# Patient Record
Sex: Female | Born: 1975 | Race: Black or African American | Hispanic: No | Marital: Single
Health system: Southern US, Community
[De-identification: ages and names within clinical notes are randomized; demographics above are authoritative.]

---

## 1999-01-16 ENCOUNTER — Other Ambulatory Visit: Admission: RE | Admit: 1999-01-16 | Discharge: 1999-01-16 | Payer: Self-pay | Admitting: Internal Medicine

## 2000-01-25 ENCOUNTER — Other Ambulatory Visit: Admission: RE | Admit: 2000-01-25 | Discharge: 2000-01-25 | Payer: Self-pay | Admitting: *Deleted

## 2001-06-18 ENCOUNTER — Inpatient Hospital Stay (HOSPITAL_COMMUNITY): Admission: AD | Admit: 2001-06-18 | Discharge: 2001-06-22 | Payer: Self-pay | Admitting: Gynecology

## 2001-06-23 ENCOUNTER — Encounter: Admission: RE | Admit: 2001-06-23 | Discharge: 2001-07-23 | Payer: Self-pay | Admitting: Gynecology

## 2001-07-28 ENCOUNTER — Other Ambulatory Visit: Admission: RE | Admit: 2001-07-28 | Discharge: 2001-07-28 | Payer: Self-pay | Admitting: *Deleted

## 2001-08-23 ENCOUNTER — Encounter: Admission: RE | Admit: 2001-08-23 | Discharge: 2001-09-22 | Payer: Self-pay | Admitting: Gynecology

## 2001-10-23 ENCOUNTER — Encounter: Admission: RE | Admit: 2001-10-23 | Discharge: 2001-11-22 | Payer: Self-pay | Admitting: Gynecology

## 2001-11-23 ENCOUNTER — Encounter: Admission: RE | Admit: 2001-11-23 | Discharge: 2001-12-23 | Payer: Self-pay | Admitting: Gynecology

## 2002-07-09 ENCOUNTER — Ambulatory Visit (HOSPITAL_COMMUNITY): Admission: RE | Admit: 2002-07-09 | Discharge: 2002-07-09 | Payer: Self-pay | Admitting: Gastroenterology

## 2003-08-25 ENCOUNTER — Other Ambulatory Visit: Admission: RE | Admit: 2003-08-25 | Discharge: 2003-08-25 | Payer: Self-pay | Admitting: Gynecology

## 2005-05-14 ENCOUNTER — Emergency Department: Payer: Self-pay | Admitting: Emergency Medicine

## 2005-08-09 ENCOUNTER — Observation Stay: Payer: Self-pay

## 2005-08-10 ENCOUNTER — Ambulatory Visit: Payer: Self-pay

## 2005-08-12 ENCOUNTER — Ambulatory Visit: Payer: Self-pay

## 2005-08-19 ENCOUNTER — Inpatient Hospital Stay: Payer: Self-pay | Admitting: Unknown Physician Specialty

## 2006-04-07 IMAGING — CT CT HEAD WITHOUT CONTRAST
2 series · 16 of 30 positions shown, 20 images · non-contrast
Comparison: none

REASON FOR EXAM: head inj/[HOSPITAL]   SHIELD/PATIENT IS 20 WEEKS PREGNANT
COMMENTS:

[Series 2: without · axial · non-contrast · 0.39mm/px · z∈[+162,+272]mm · 13 of 26 slices shown, 17 images]
[im 2/26  brain]
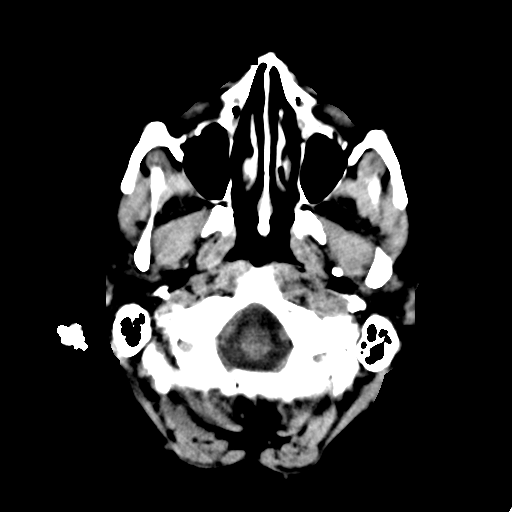
[im 2/26  bone]
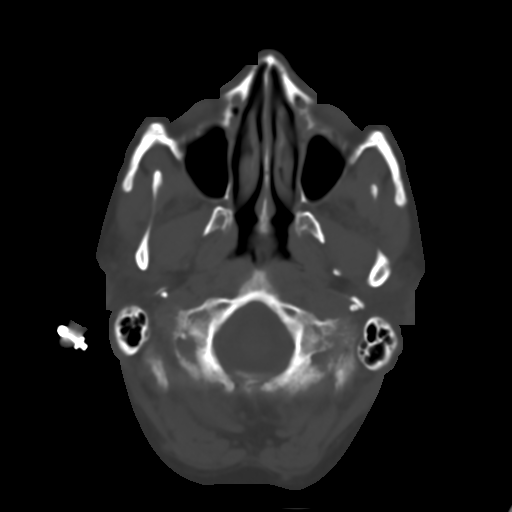
[im 4/26  brain]
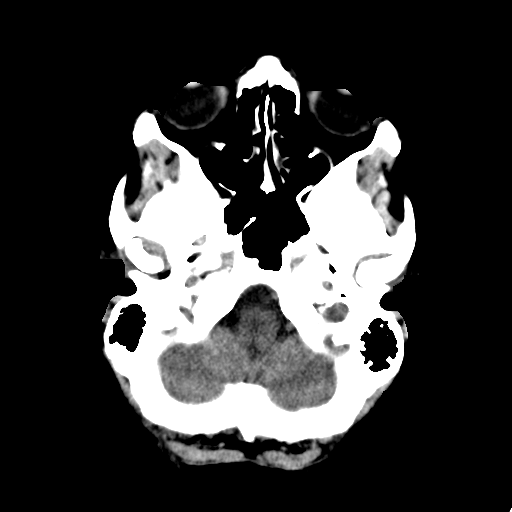
[im 6/26  brain]
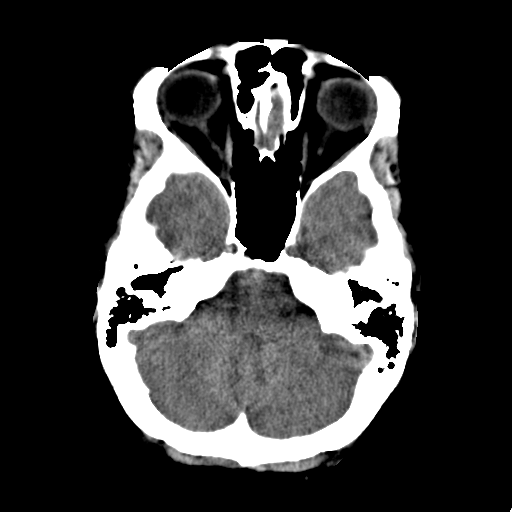
[im 8/26  brain]
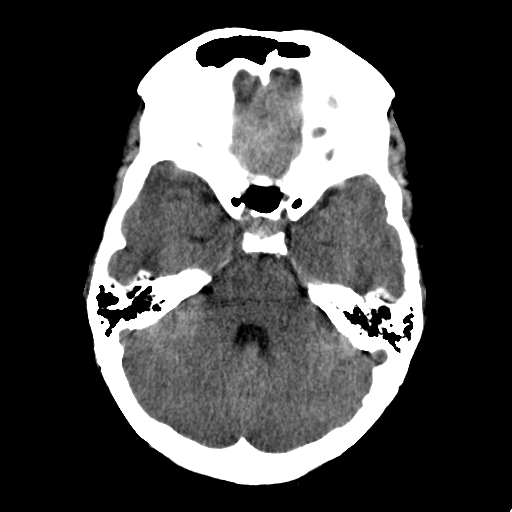
[im 9/26  brain]
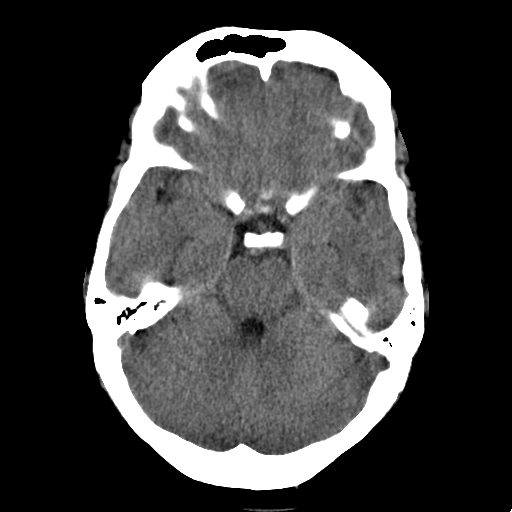
[im 9/26  bone]
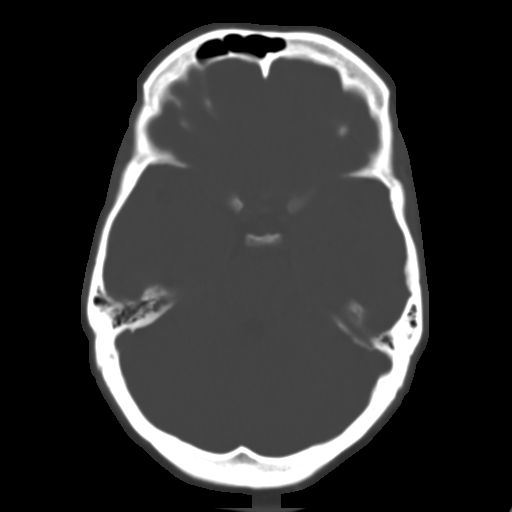
[im 11/26  brain]
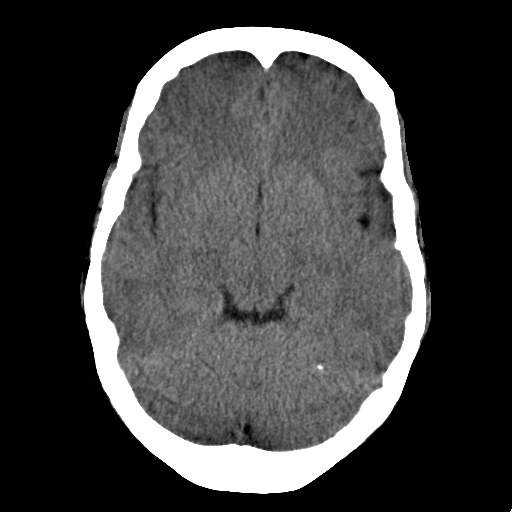
[im 13/26  brain]
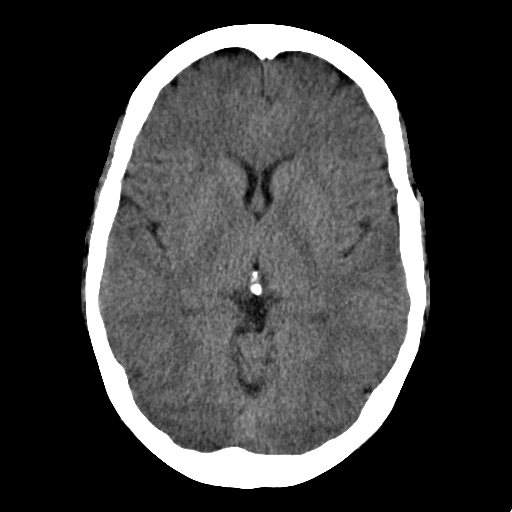
[im 15/26  brain]
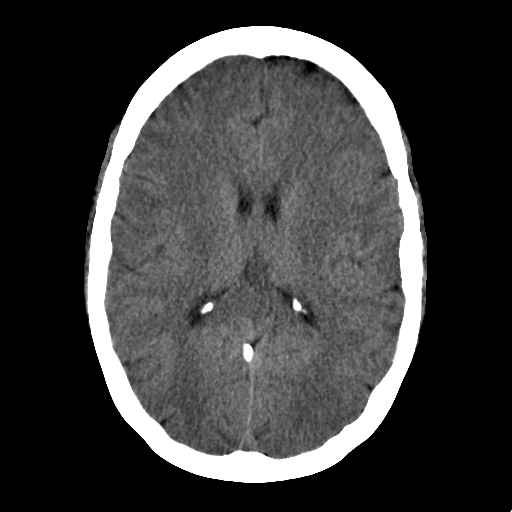
[im 17/26  brain]
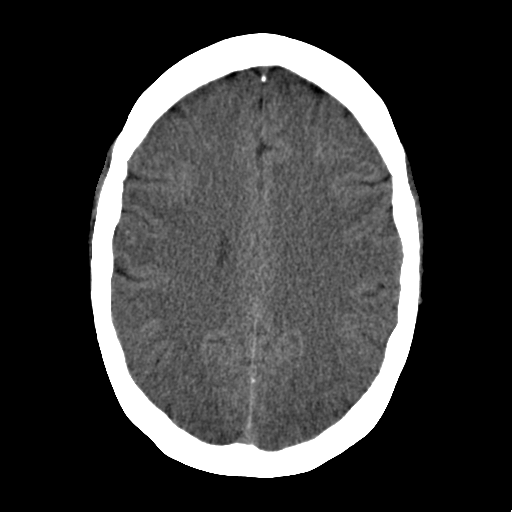
[im 17/26  bone]
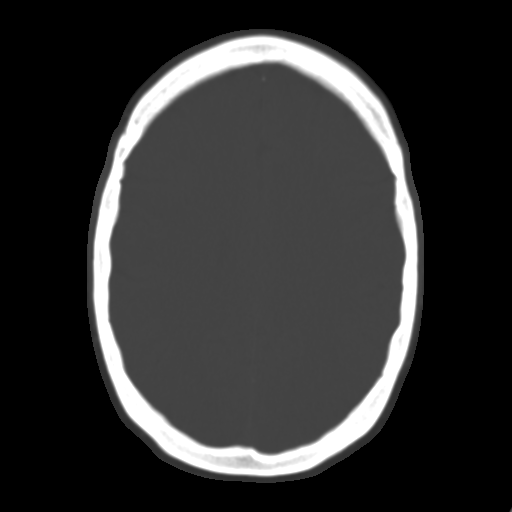
[im 18/26  brain]
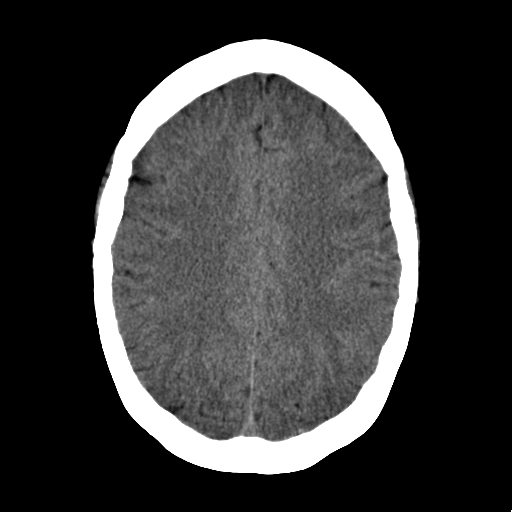
[im 20/26  brain]
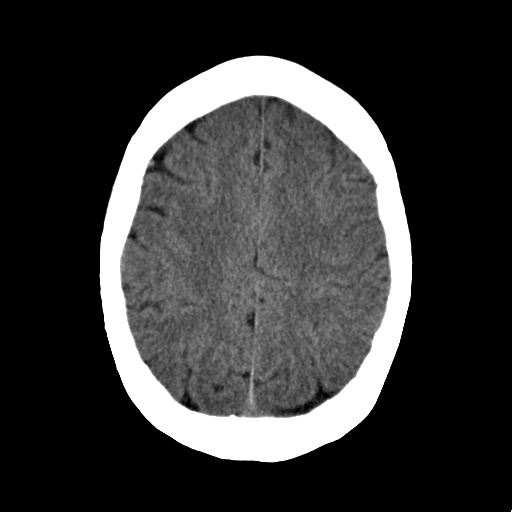
[im 22/26  brain]
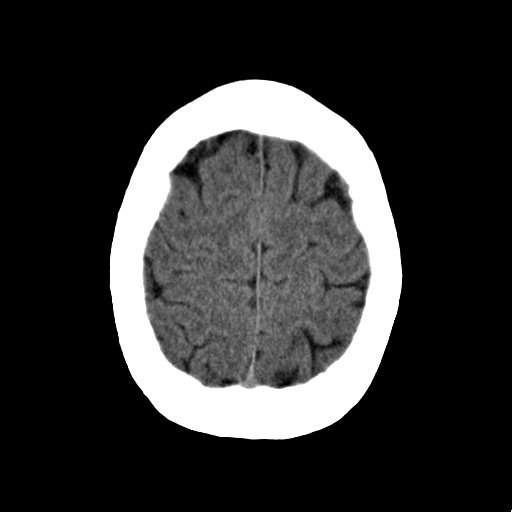
[im 24/26  brain]
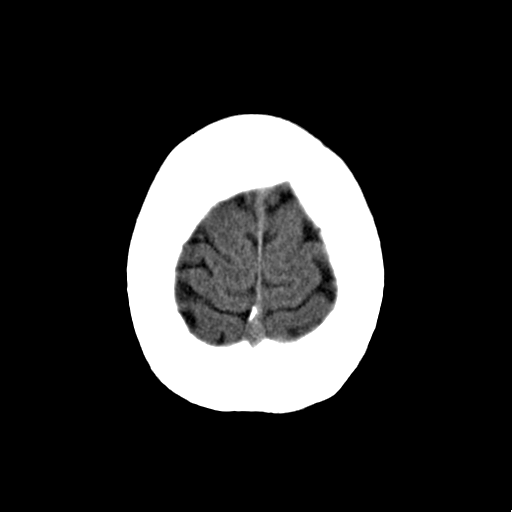
[im 24/26  bone]
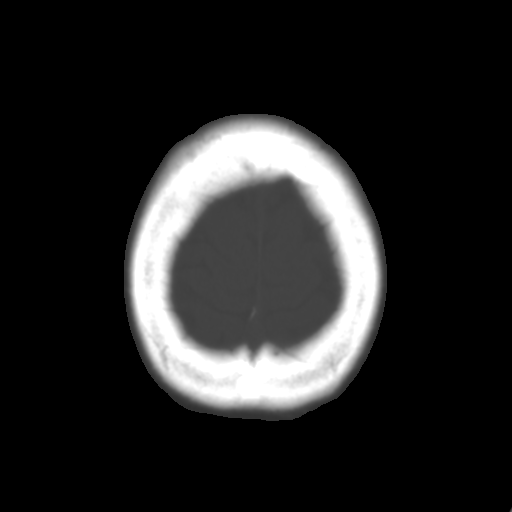

[Series 3: bone windows · axial · 0.39mm/px · z∈[+162,+198]mm · 3 of 26 slices shown]
[im 2/26  bone]
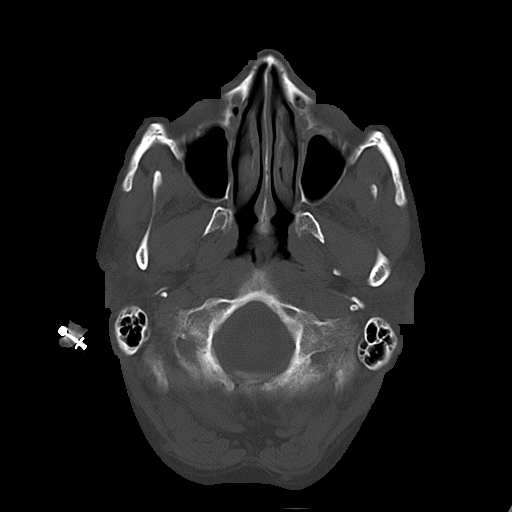
[im 6/26  bone]
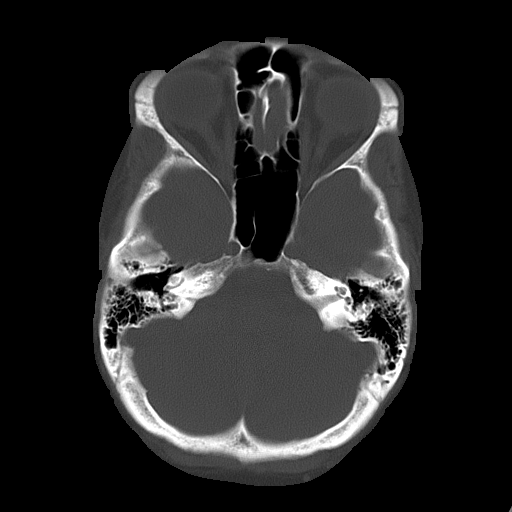
[im 9/26  bone]
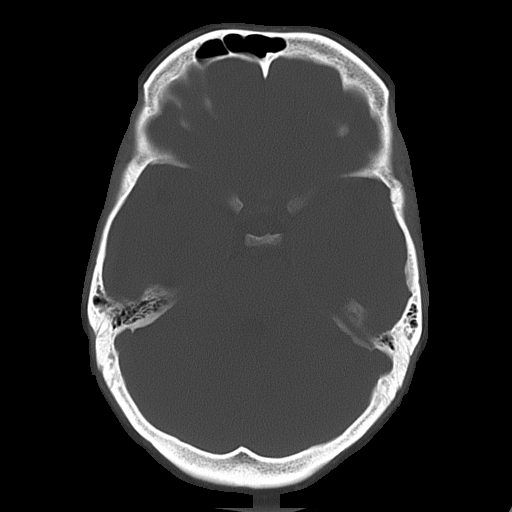

[16 of 30 positions shown; findings below may reference images not displayed]

PROCEDURE:     CT  - CT HEAD WITHOUT CONTRAST  - May 14, 2005  [DATE]

RESULT:       Unenhanced emergent head CT was performed.  No intracerebral
bleeds or infarcts are seen.  No mass effect.  No shift of the midline.
The ventricles appear within normal limits in size.  No extra-axial fluid
collections are noted.

No definite bony abnormalities are noted on the unenhanced head CT.  There
appears some minimal soft tissue swelling over the RIGHT frontal bone.
IMPRESSION: No significant abnormalities identified on the unenhanced emergent head CT.

The exam was originally read by [HOSPITAL].

## 2007-04-08 ENCOUNTER — Ambulatory Visit: Payer: Self-pay | Admitting: Internal Medicine

## 2007-12-05 ENCOUNTER — Emergency Department: Payer: Self-pay | Admitting: Emergency Medicine

## 2008-10-28 IMAGING — US US OB < 14 WEEKS - US OB TV
1 series · 17 of 28 positions shown · non-contrast
Comparison: none

REASON FOR EXAM: Vaginal bleeding, patient is pregnant - 9 weeks, 4 days
by LMP
COMMENTS:

[Series 1: us ob < 14 weeks - us ob tv · 17 of 43 slices shown]
[im 1/43]
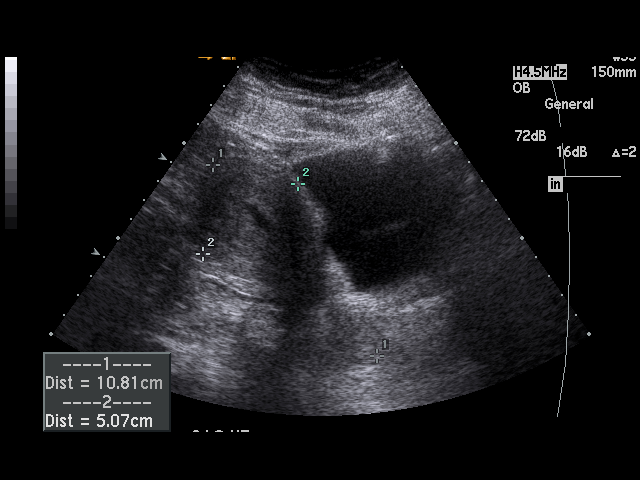
[im 4/43]
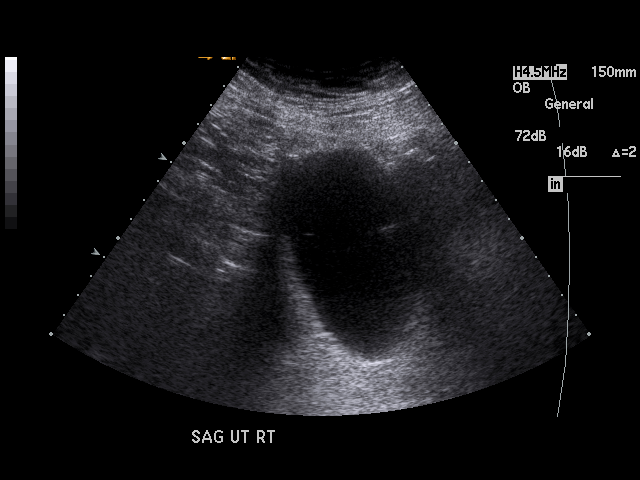
[im 7/43]
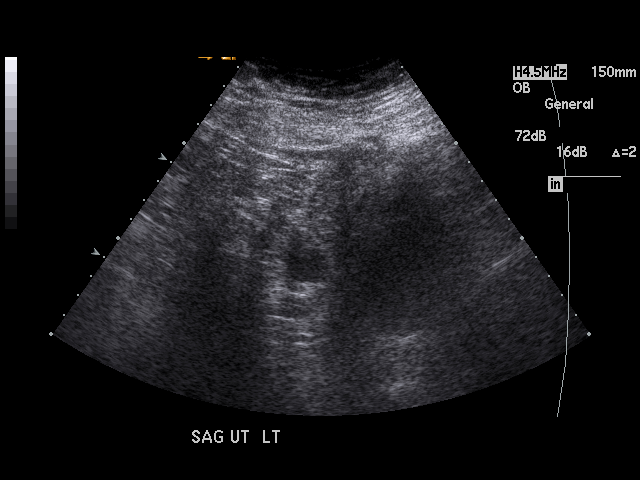
[im 8/43]
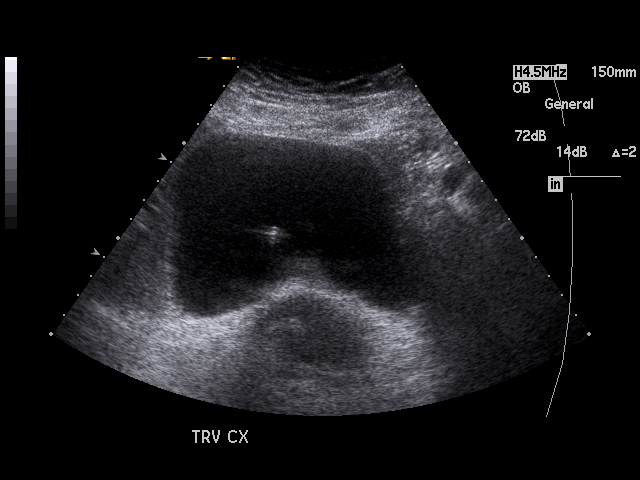
[im 11/43]
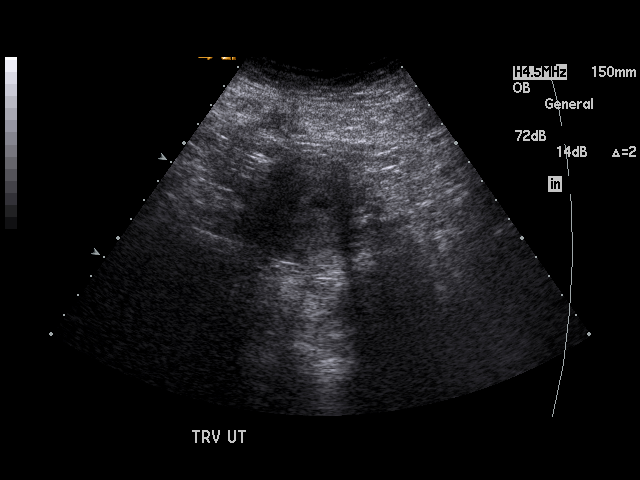
[im 15/43]
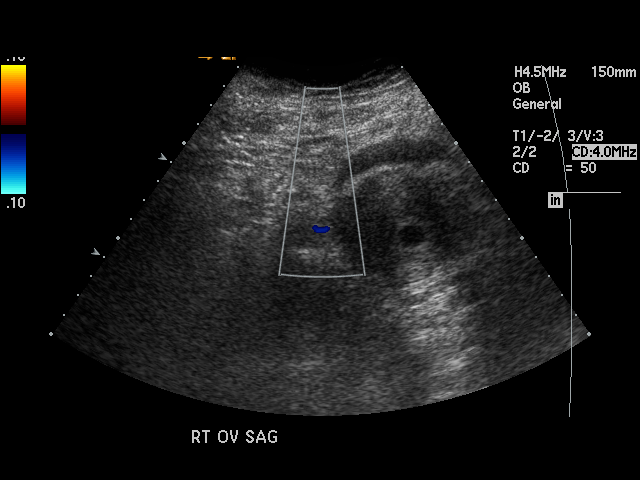
[im 16/43]
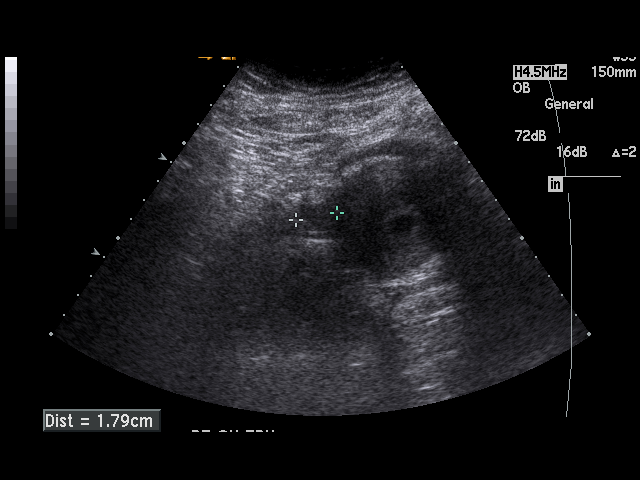
[im 19/43]
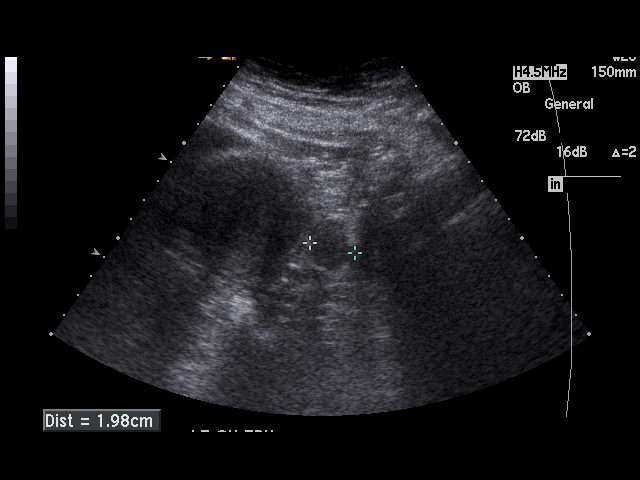
[im 22/43]
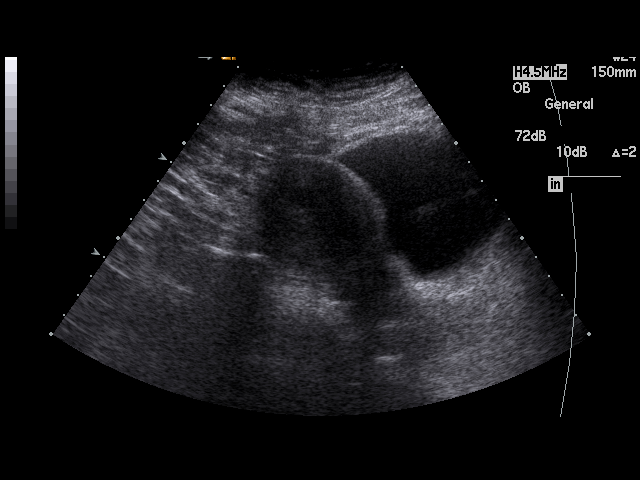
[im 24/43]
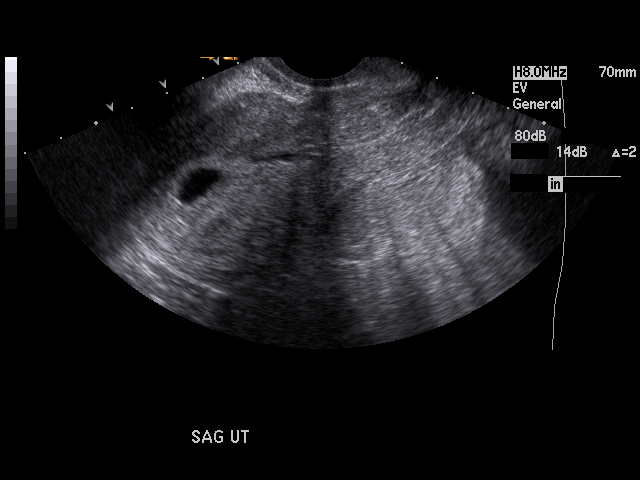
[im 27/43]
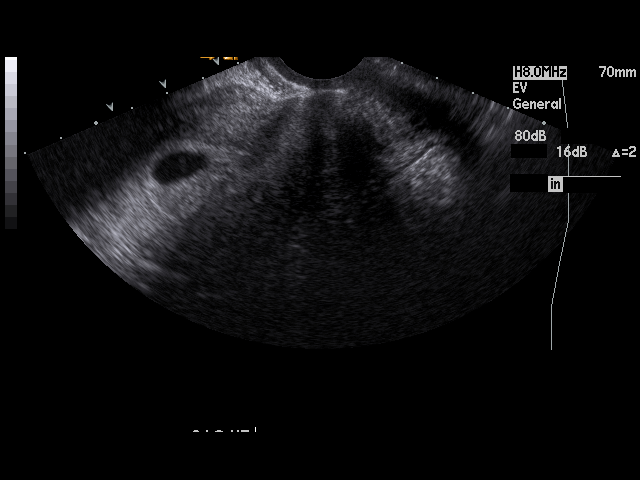
[im 29/43]
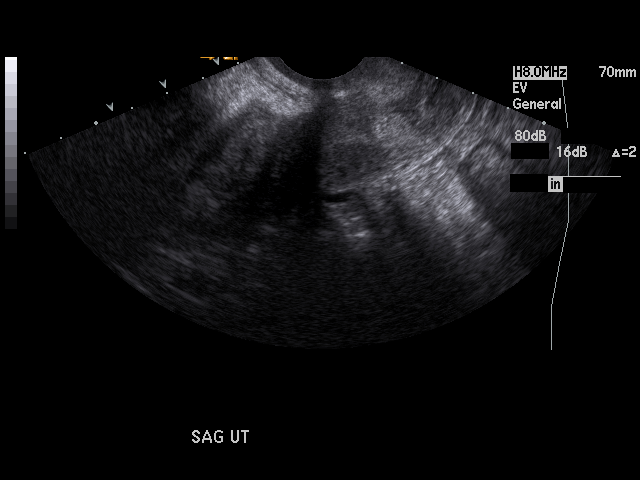
[im 32/43]
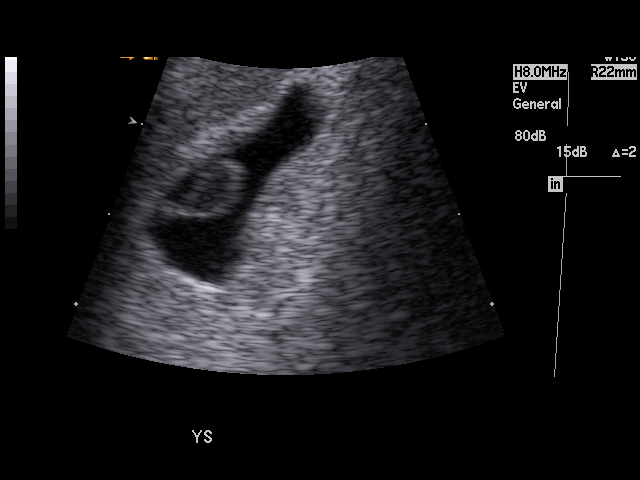
[im 35/43]
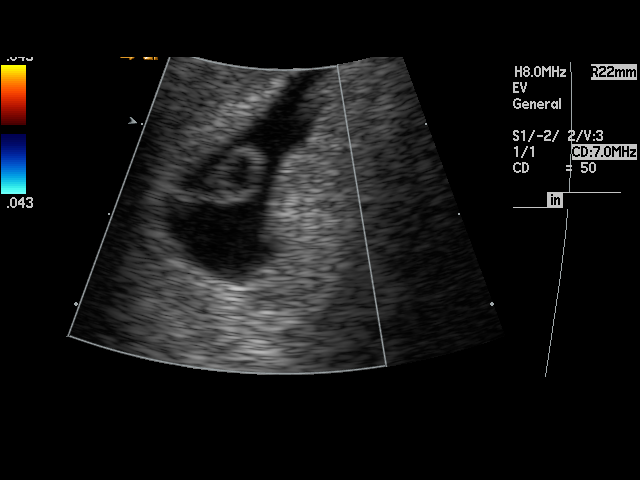
[im 36/43]
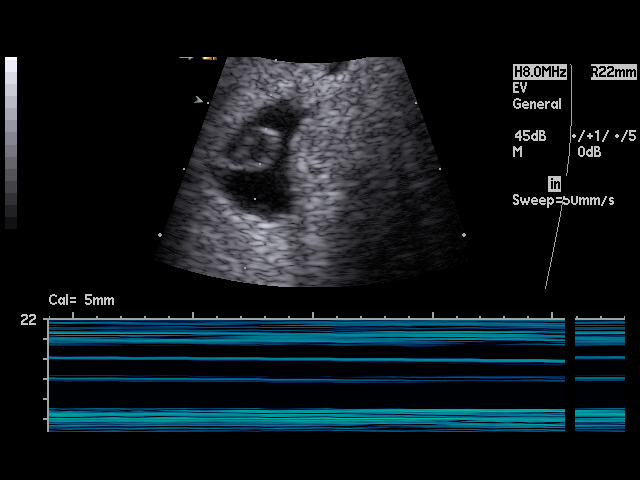
[im 39/43]
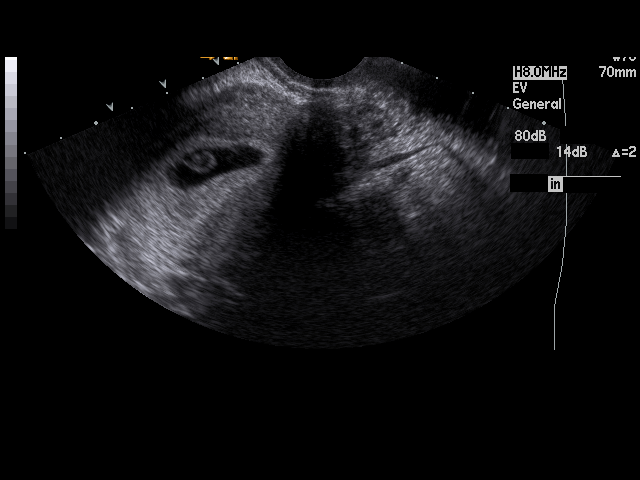
[im 43/43]
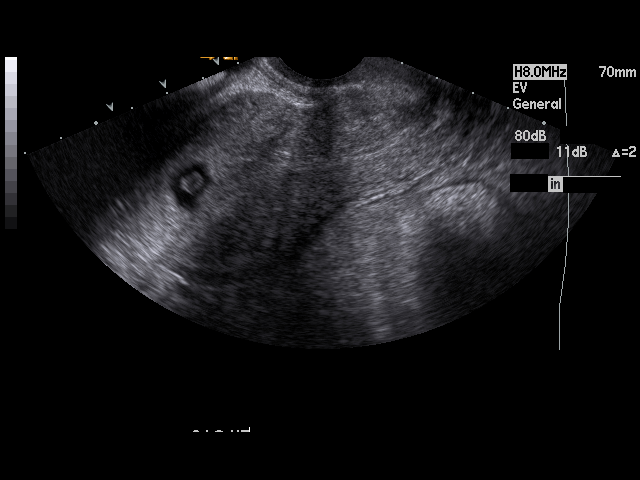

[17 of 28 positions shown; findings below may reference images not displayed]

PROCEDURE:     US  - US OB LESS THAN 14 WEEKS  - December 05, 2007  [DATE]

RESULT:     Endovaginal imaging of the pelvis was obtained.

An intrauterine gestational sac is appreciated with sac diameter of 2.4 cm.
This corresponds to an estimated gestational age of 7 weeks, 0 days. A yolk
sac is identified measuring 0.54 cm in diameter. There is no evidence of
fetal cardiac signal. The yolk sac is thick walled and slightly irregular. A
small cyst is identified within the endometrium. Evaluation of the RIGHT
ovary demonstrates dimensions of 1.73 x 1.98 x 1.79 cm. The LEFT ovary
measures 2.4 x 1.73 x 1.98 cm. There does not appear to be evidence of
pelvic free fluid or loculated fluid collections.
IMPRESSION: 1.     Abnormal appearance of an intrauterine gestational sac. There does
not appear to be evidence of an appreciable fetal pole. These findings are
concerning for an embryonic pregnancy or possibly early embryonic demise
versus early pregnancy. Correlation with serial Beta-HCG is recommended as
well a possible re-evaluation with ultrasound.
2.     Dr. Viruet of the Emergency Department was informed of these
findings via preliminary faxed report on 12/05/2007 at [DATE], [facility]
Standard Time.

## 2008-11-16 ENCOUNTER — Ambulatory Visit: Payer: Self-pay | Admitting: Internal Medicine

## 2009-04-09 ENCOUNTER — Ambulatory Visit: Payer: Self-pay | Admitting: Internal Medicine

## 2010-01-09 ENCOUNTER — Ambulatory Visit: Payer: Self-pay | Admitting: Family Medicine

## 2010-12-22 ENCOUNTER — Ambulatory Visit: Payer: Self-pay | Admitting: Internal Medicine

## 2011-11-30 ENCOUNTER — Ambulatory Visit: Payer: Self-pay

## 2012-07-16 ENCOUNTER — Ambulatory Visit: Payer: Self-pay | Admitting: Obstetrics and Gynecology

## 2012-07-16 LAB — CBC WITH DIFFERENTIAL/PLATELET
Basophil #: 0 10*3/uL (ref 0.0–0.1)
Eosinophil #: 0 10*3/uL (ref 0.0–0.7)
HCT: 38.5 % (ref 35.0–47.0)
Lymphocyte #: 1.6 10*3/uL (ref 1.0–3.6)
MCH: 29.3 pg (ref 26.0–34.0)
MCHC: 34.4 g/dL (ref 32.0–36.0)
MCV: 85 fL (ref 80–100)
Monocyte #: 0.8 x10 3/mm (ref 0.2–0.9)
Neutrophil #: 5.6 10*3/uL (ref 1.4–6.5)
RDW: 15 % — ABNORMAL HIGH (ref 11.5–14.5)

## 2012-07-17 ENCOUNTER — Inpatient Hospital Stay: Payer: Self-pay | Admitting: Obstetrics and Gynecology

## 2012-07-18 LAB — HEMATOCRIT: HCT: 32 % — ABNORMAL LOW (ref 35.0–47.0)

## 2012-08-24 ENCOUNTER — Other Ambulatory Visit (HOSPITAL_COMMUNITY): Payer: Self-pay | Admitting: Gastroenterology

## 2012-08-24 DIAGNOSIS — R945 Abnormal results of liver function studies: Secondary | ICD-10-CM

## 2012-09-17 ENCOUNTER — Ambulatory Visit (HOSPITAL_COMMUNITY)
Admission: RE | Admit: 2012-09-17 | Discharge: 2012-09-17 | Disposition: A | Discharge status: Home / Self Care | Payer: 59 | Source: Ambulatory Visit | Attending: Gastroenterology | Admitting: Gastroenterology

## 2012-09-17 DIAGNOSIS — R945 Abnormal results of liver function studies: Secondary | ICD-10-CM

## 2012-09-17 DIAGNOSIS — R161 Splenomegaly, not elsewhere classified: Secondary | ICD-10-CM | POA: Insufficient documentation

## 2012-09-17 DIAGNOSIS — R7989 Other specified abnormal findings of blood chemistry: Secondary | ICD-10-CM | POA: Insufficient documentation

## 2013-08-11 IMAGING — US US ABDOMEN COMPLETE
1 series · 14 of 25 positions shown · non-contrast
Comparison: None.

CLINICAL DATA: Abnormal liver function tests

COMPLETE ABDOMINAL ULTRASOUND

[Series 1: us abdomen complete · 0.32mm/px · 14 of 77 slices shown]
[im 1/77]
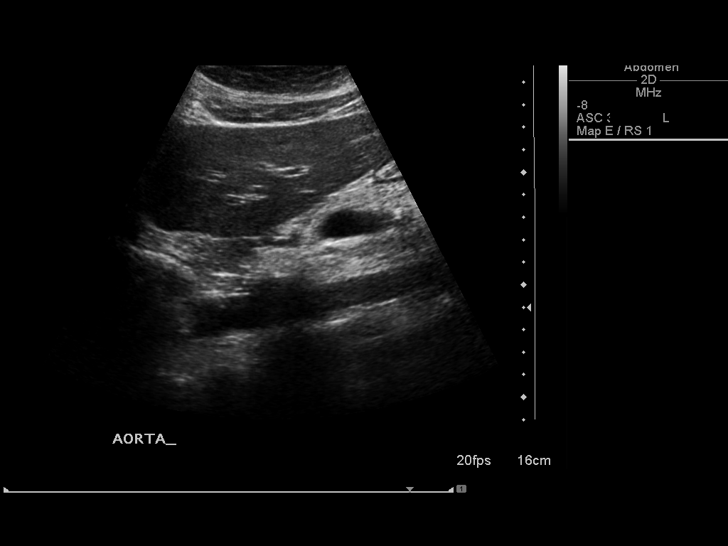
[im 7/77]
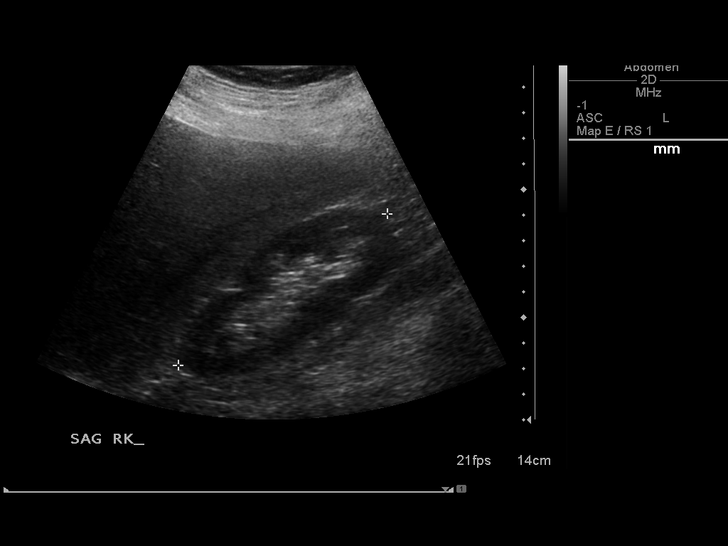
[im 13/77]
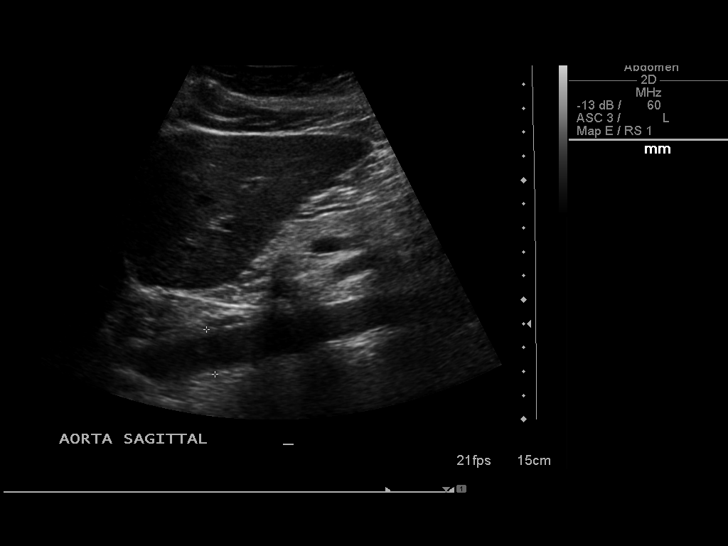
[im 20/77]
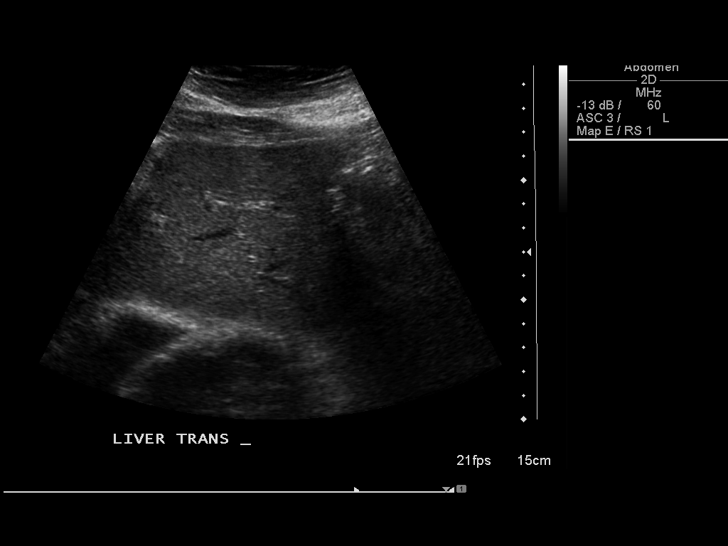
[im 26/77]
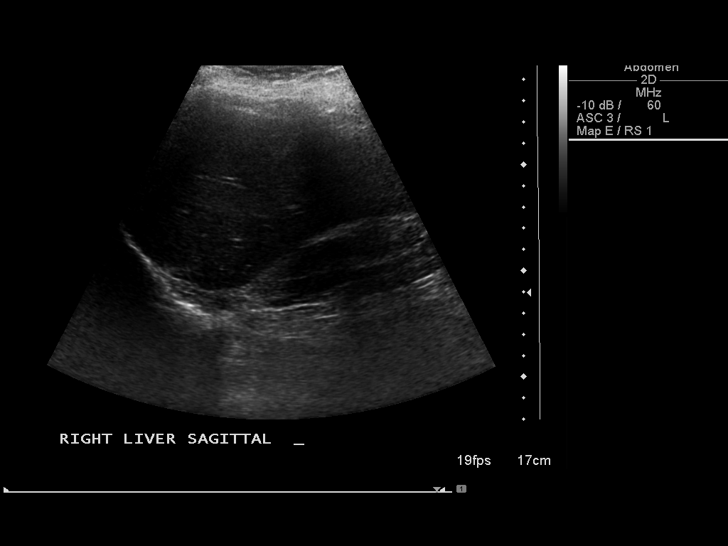
[im 29/77]
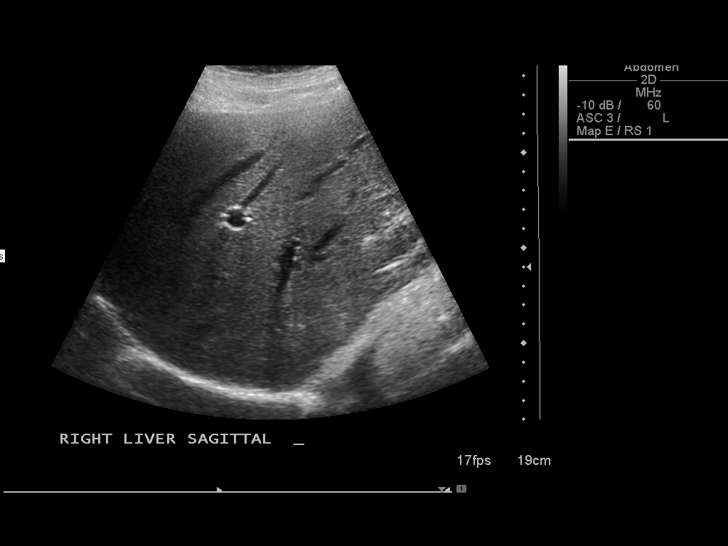
[im 35/77]
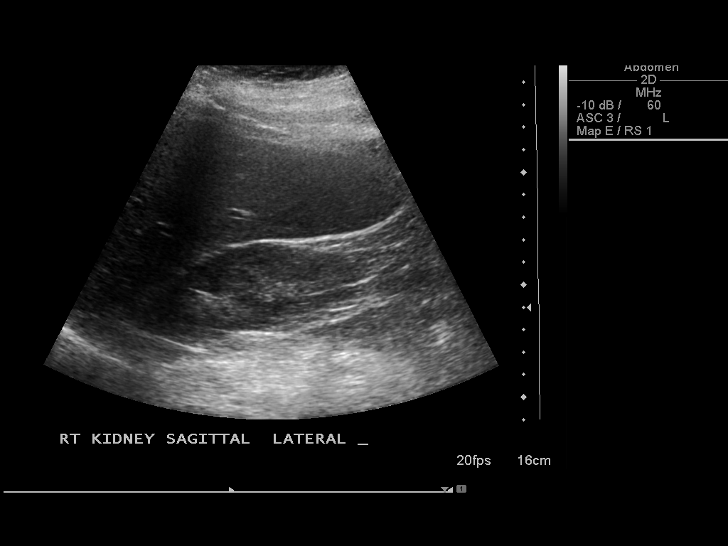
[im 42/77]
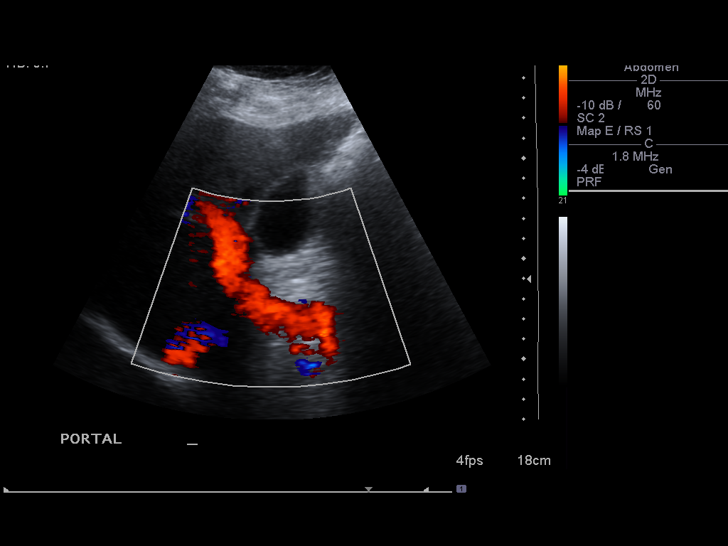
[im 48/77]
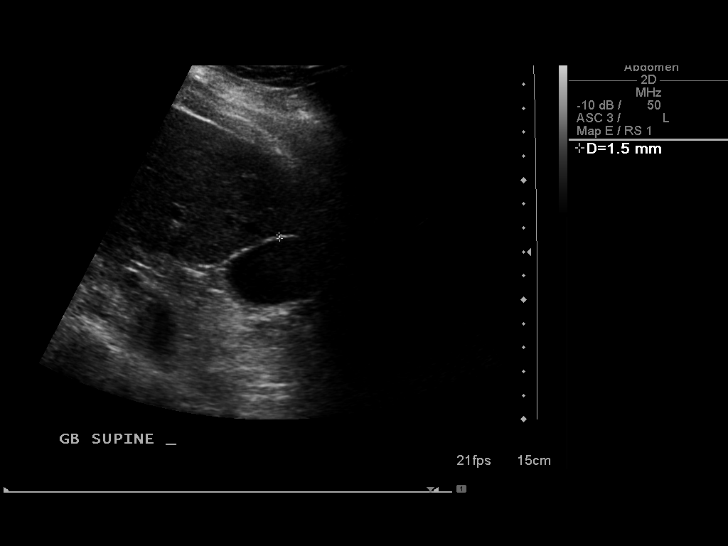
[im 51/77]
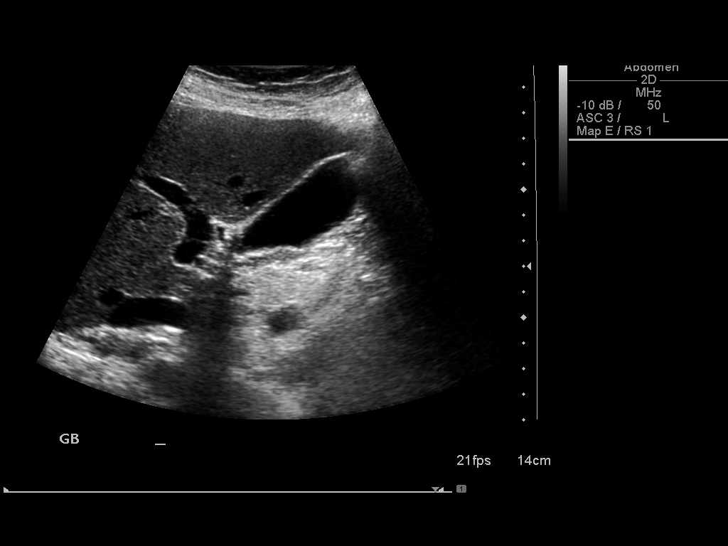
[im 58/77]
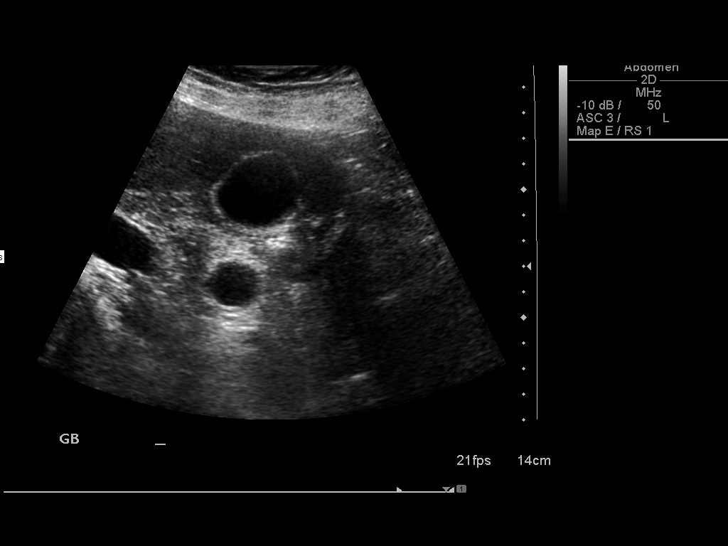
[im 64/77]
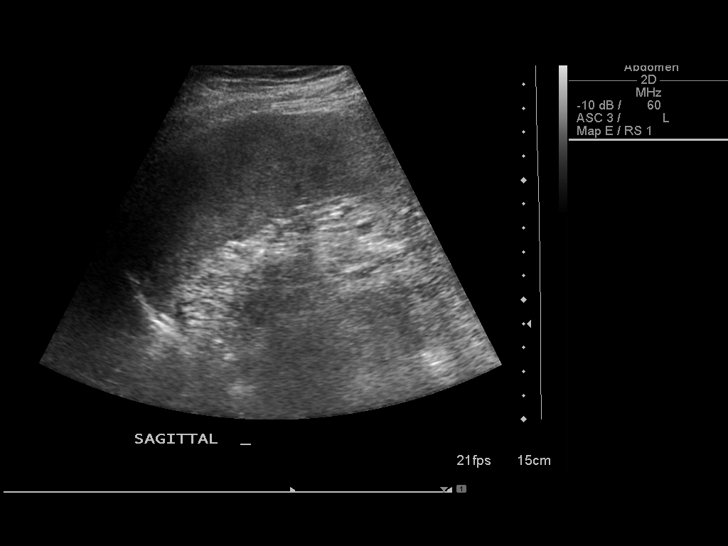
[im 70/77]
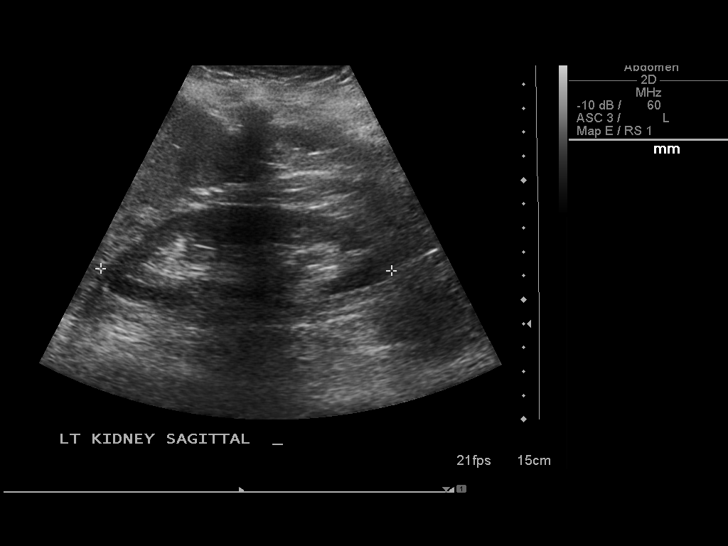
[im 77/77]
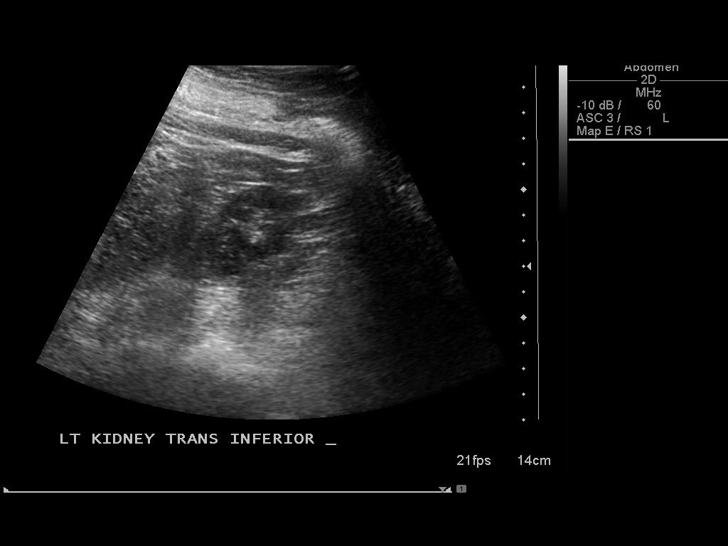

[14 of 25 positions shown; findings below may reference images not displayed]

FINDINGS: Gallbladder:  The gallbladder is visualized and no gallstones are
noted.  There is no pain over the gallbladder with compression.

Common bile duct:  The common bile duct is normal measuring 3.4 mm
in diameter.

Liver:  The liver has a normal echogenic pattern.  No ductal
dilatation is seen.

IVC:  Appears normal.

Pancreas:  No focal abnormality seen. Portions of the head and tail
of the pancreas are obscured by bowel gas.

Spleen:  The spleen measures 13.6 cm sagittally with a volume of
516 ml indicating mild splenomegaly.

Right Kidney:  No hydronephrosis is seen.  The right kidney
measures 11.4 cm sagittally.

Left Kidney:  No hydronephrosis is noted.  The left kidney measures
12.2 cm.

Abdominal aorta:  The abdominal aorta is normal in caliber.
IMPRESSION: 1.  Splenomegaly.
2.  No gallstones.
3.  The pancreas is partially obscured by bowel gas.

## 2015-01-24 NOTE — Op Note (Signed)
PATIENT NAME:  Amy Archer, Amy Archer MR#:  [id number] DATE OF BIRTH:  05-20-1976  DATE OF PROCEDURE:  07/17/2012  PREOPERATIVE DIAGNOSIS: Repeat cesarean section, desiring permanent sterilization.   POSTOPERATIVE DIAGNOSIS: Repeat cesarean section, desiring permanent sterilization.   PROCEDURE: Tertiary LUT cesarean section with postpartum tubal ligation.   SURGEON: Elliot Gurneyarrie C. Luvina Poirier, Archer.D.   ASSISTANT: Verlin GrillsEryn Stansbury Clipp, MD   ESTIMATED BLOOD LOSS: 1000 mL.   FINDINGS: Term liveborn fetus was normal uterus, tubes and ovaries bilaterally with tubal ostia seen post tubal ligation.   DESCRIPTION OF PROCEDURE: The patient was taken to the Operating Room and placed in the supine position. After adequate epidural anesthesia was instilled, the patient was prepped and draped in the usual sterile fashion. A Pfannenstiel skin incision was made through the old scar. The incision was carried sharply down to the fascia, the fascia was nicked in the midline and the incision was extended in a superolateral manner. The incision was extended, the rectus muscles were sharply and bluntly dissected off the rectus fascia, and the midline of the muscle was identified and separated. The muscle bellies identified. A bladder blade was placed. A bladder flap was created. Uterine incision was made and extended with the surgeon's fingers. The infant's head was delivered. Baby was bulb suctioned and delivered. Cord was clamped and cut and handed off to the pediatrician. Pitocin was given. Placenta was delivered. The belly was irrigated. The uterus was left in moist laparotomy sponges. The interior of the uterus was curetted. The uterine incision was closed in a running locked chromic fashion. An imbricating suture was done. The bladder flap was tacked back up with a 3-0 chromic. Attention was then turned to the tubes. After dousing with  Marcaine, the tubes were grasped with Babcock clamps. An incision was made in the peritoneum. The  broad ligament and two pieces of plain gut suture were used to tie off a 2 cm section of the tubes. The tubes were cut. Good telescoping was identified. Good hemostasis was identified. The uterus was then placed back into the abdomen and the muscle bellies were approximated. The On-Q pain pump was placed. The fascia was closed. Plain gut suture was used to approximate the subcutaneous fat. Dissolvable staples were placed. Good hemostasis was identified. The was laid supine. The fundus was firm. She was taken to the recovery room. The On-Q pain pump was installed and primed. ____________________________ Elliot Gurneyarrie C. Zakhia Seres, MD cck:slb D: 07/22/2012 15:30:12 ET T: 07/22/2012 16:05:44 ET JOB#: [id number]  cc: Elliot Gurneyarrie C. Toluwani Ruder, MD, <Dictator> Elliot GurneyARRIE C Loriel Diehl MD ELECTRONICALLY SIGNED 07/27/2012 14:11
